# Patient Record
Sex: Female | Born: 2000 | Race: White | Hispanic: No | Marital: Single | Smoking: Never smoker
Health system: Southern US, Community
[De-identification: ages and names within clinical notes are randomized; demographics above are authoritative.]

## PROBLEM LIST (undated history)

## (undated) DIAGNOSIS — F909 Attention-deficit hyperactivity disorder, unspecified type: Secondary | ICD-10-CM

## (undated) DIAGNOSIS — F988 Other specified behavioral and emotional disorders with onset usually occurring in childhood and adolescence: Principal | ICD-10-CM

## (undated) HISTORY — PX: WISDOM TOOTH EXTRACTION: SHX21

## (undated) HISTORY — DX: Attention-deficit hyperactivity disorder, unspecified type: F90.9

## (undated) HISTORY — DX: Other specified behavioral and emotional disorders with onset usually occurring in childhood and adolescence: F98.8

---
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
## 2022-12-15 ENCOUNTER — Ambulatory Visit
Admission: RE | Admit: 2022-12-15 | Discharge: 2022-12-15 | Disposition: A | Discharge status: Home / Self Care | Payer: Managed Care, Other (non HMO) | Source: Ambulatory Visit | Attending: Family Medicine | Admitting: Family Medicine

## 2022-12-15 ENCOUNTER — Other Ambulatory Visit: Payer: Self-pay | Admitting: Family Medicine

## 2022-12-15 DIAGNOSIS — R1032 Left lower quadrant pain: Secondary | ICD-10-CM

## 2023-02-11 ENCOUNTER — Encounter: Payer: Self-pay | Admitting: Cardiology

## 2023-02-11 ENCOUNTER — Ambulatory Visit: Payer: 59 | Admitting: Cardiology

## 2023-02-11 ENCOUNTER — Other Ambulatory Visit: Payer: 59

## 2023-02-11 VITALS — BP 108/65 | HR 77 | Ht 67.0 in | Wt 164.0 lb

## 2023-02-11 DIAGNOSIS — R002 Palpitations: Secondary | ICD-10-CM

## 2023-02-11 DIAGNOSIS — Z8249 Family history of ischemic heart disease and other diseases of the circulatory system: Secondary | ICD-10-CM

## 2023-02-11 NOTE — Progress Notes (Signed)
Primary Physician/Referring:  Carilyn Goodpasture, NP  Patient ID: Gail Le, female    DOB: 12-03-2000, 22 y.o.   MRN: [id number]  Chief Complaint  Patient presents with   Dizziness and giddiness   Family history of ischemic heart disease and other diseases   Tachycardia, unspecified   New Patient (Initial Visit)   HPI:    Gail Le  is a 22 y.o.  fairly active Caucasian female patient referred to me for evaluation of palpitations.  Patient started having symptoms about a month ago, states that with physical activity and exercise she notices rapid heartbeat 37 new onset but does feel like it comes down slowly.  Sometimes associated with mild dizziness.  No chest pain.  She has at least 1-2 episodes over 3 to 4 days.  Otherwise she is asymptomatic, does not smoke, use tobacco products, no history of excessive alcohol intake, no illicit drug use, no recent weight changes.  She also does not drink excessive amounts of caffeine.   Past Medical History:  Diagnosis Date   ADD (attention deficit disorder) 07/20/2014   ADHD (attention deficit hyperactivity disorder)    Past Surgical History:  Procedure Laterality Date   MASS EXCISION Right 03/14/2019   Procedure: EXCISION OF RIGHT BREAST MASS;  Surgeon: Emelia Loron, MD;  Location: [facility];  Service: General;  Laterality: Right;   WISDOM TOOTH EXTRACTION     Family History  Problem Relation Age of Onset   Cardiomyopathy Father    Atrial fibrillation Maternal Grandfather    Cancer Maternal Grandfather     Social History   Tobacco Use   Smoking status: Never   Smokeless tobacco: Never  Substance Use Topics   Alcohol use: No   Marital Status: Single  ROS  Review of Systems  Cardiovascular:  Positive for palpitations. Negative for chest pain, dyspnea on exertion and leg swelling.   Objective      02/11/2023    9:15 AM 03/14/2019   10:15 AM 03/14/2019   10:00 AM  Vitals with BMI  Height 5\' 7"     Weight  164 lbs    BMI 25.68    Systolic 108 102 601  Diastolic 65 64 62  Pulse 77 74 83   SpO2: 99 %  Physical Exam Neck:     Vascular: No carotid bruit or JVD.  Cardiovascular:     Rate and Rhythm: Normal rate and regular rhythm.     Pulses: Intact distal pulses.     Heart sounds: Normal heart sounds. No murmur heard.    No gallop.  Pulmonary:     Effort: Pulmonary effort is normal.     Breath sounds: Normal breath sounds.  Abdominal:     General: Bowel sounds are normal.     Palpations: Abdomen is soft.  Musculoskeletal:     Right lower leg: No edema.     Left lower leg: No edema.    Laboratory examination:   External labs:   Labs 08/24/2022:  TSH normal at 1.51.  Hb 14.1/HCT 41.4, platelets 262, normal indicis.  Serum glucose 68 mg, BUN 9, creatinine 0.83, EGFR 103 mL, potassium 3.8, LFTs normal.  Total cholesterol 171, triglycerides 161, HDL 57, LDL 86.  Non-HDL cholesterol 114.  Radiology:    Cardiac Studies:   NA EKG:   EKG 02/11/2023: Normal sinus rhythm at rate of 82 beats minute, incomplete right bundle branch block.  Normal EKG.   Medications and allergies  No Known Allergies  Medication list   Current Outpatient Medications:    ADDERALL XR 25 MG 24 hr capsule, Take 25 mg by mouth every morning., Disp: , Rfl:    cetirizine (ZYRTEC ALLERGY) 10 MG tablet, Take 10 mg by mouth daily., Disp: , Rfl:    Ferrous Sulfate (IRON) 325 (65 Fe) MG TABS, Take 325 mg by mouth daily., Disp: , Rfl:    Magnesium 250 MG TABS, Take 2 tablets by mouth daily at 2 PM., Disp: , Rfl:    norgestimate-ethinyl estradiol (ORTHO-CYCLEN) 0.25-35 MG-MCG tablet, Take 1 tablet by mouth daily., Disp: , Rfl:   Assessment     ICD-10-CM   1. Tachycardia, unspecified  R00.0 EKG 12-Lead       Orders Placed This Encounter  Procedures   EKG 12-Lead    No orders of the defined types were placed in this encounter.   Medications Discontinued During This Encounter  Medication Reason    lisdexamfetamine (VYVANSE) 30 MG capsule      Recommendations:   Gail Le is a 22 y.o. fairly active Caucasian female patient referred to me for evaluation of palpitations.  1. Rapid palpitations Patient has rapid onset of palpitations but when they do stop, this slowly gradually come down, symptoms are indicated of both SVT and also they could be PACs and PVCs.  She will need Zio patch monitoring.  An echocardiogram was also ordered in view of patient's family history of hypertrophic cardiomyopathy in her father.  I will discuss with her father's cardiologist to see whether or genetic testing has been performed so it would impact his progeny.  - EKG 12-Lead - PCV ECHOCARDIOGRAM COMPLETE; Future - LONG TERM MONITOR (3-14 DAYS); Future  2. Family history of hypertrophic cardiomyopathy Father has been diagnosed with hypertrophic cardiomyopathy. He has also had NSVT episodes and heart failure and ascending aortic aneurysm and is SP ICD implant as well.  Complete details not aware, I will try to find out if he has had genetic testing.  An echocardiogram has been ordered for the patient herself.  I will see her back in 3 to 4 weeks for follow-up.  Advised no restrictions for now.  - PCV ECHOCARDIOGRAM COMPLETE; Future    Yates Decamp, MD, [facility] 02/11/2023, 10:23 AM Office: [phone]

## 2023-03-12 ENCOUNTER — Other Ambulatory Visit: Payer: 59

## 2023-03-17 ENCOUNTER — Encounter: Payer: Self-pay | Admitting: Cardiology

## 2023-03-17 ENCOUNTER — Ambulatory Visit: Payer: 59 | Admitting: Cardiology

## 2023-03-17 VITALS — BP 115/77 | HR 74 | Resp 16 | Ht 67.0 in | Wt 158.2 lb

## 2023-03-17 DIAGNOSIS — R002 Palpitations: Secondary | ICD-10-CM

## 2023-03-17 NOTE — Progress Notes (Signed)
Primary Physician/Referring:  Carilyn Goodpasture, NP  Patient ID: Gail Le, female    DOB: 2001/06/07, 22 y.o.   MRN: [id number]  Chief Complaint  Patient presents with   Palpitations   Follow-up   HPI:    Gail Le  is a 22 y.o.  fairly active Caucasian female patient referred to me for evaluation of palpitations.  Patient started having symptoms about a month ago, states that with physical activity and exercise she notices rapid heartbeat 37 new onset but does feel like it comes down slowly.  Sometimes associated with mild dizziness.  No chest pain.  She has at least 1-2 episodes over 3 to 4 days.  Otherwise she is asymptomatic, does not smoke, use tobacco products, no history of excessive alcohol intake, no illicit drug use, no recent weight changes.  She also does not drink excessive amounts of caffeine.  Underwent event monitor and presents for follow-up.  Past Medical History:  Diagnosis Date   ADD (attention deficit disorder) 07/20/2014   ADHD (attention deficit hyperactivity disorder)    Past Surgical History:  Procedure Laterality Date   MASS EXCISION Right 03/14/2019   Procedure: EXCISION OF RIGHT BREAST MASS;  Surgeon: Emelia Loron, MD;  Location: [facility];  Service: General;  Laterality: Right;   WISDOM TOOTH EXTRACTION     Family History  Problem Relation Age of Onset   Cardiomyopathy Father    Atrial fibrillation Maternal Grandfather    Cancer Maternal Grandfather     Social History   Tobacco Use   Smoking status: Never   Smokeless tobacco: Never  Substance Use Topics   Alcohol use: No   Marital Status: Single  ROS  Review of Systems  Cardiovascular:  Positive for palpitations. Negative for chest pain, dyspnea on exertion and leg swelling.   Objective      03/17/2023   11:40 AM 02/11/2023    9:15 AM 03/14/2019   10:15 AM  Vitals with BMI  Height 5\' 7"  5\' 7"    Weight 158 lbs 3 oz 164 lbs   BMI 24.77 25.68   Systolic 115 108  102  Diastolic 77 65 64  Pulse 74 77 74   SpO2: 98 %  Physical Exam Neck:     Vascular: No carotid bruit or JVD.  Cardiovascular:     Rate and Rhythm: Normal rate and regular rhythm.     Pulses: Intact distal pulses.     Heart sounds: Normal heart sounds. No murmur heard.    No gallop.  Pulmonary:     Effort: Pulmonary effort is normal.     Breath sounds: Normal breath sounds.  Abdominal:     General: Bowel sounds are normal.     Palpations: Abdomen is soft.  Musculoskeletal:     Right lower leg: No edema.     Left lower leg: No edema.    Laboratory examination:   External labs:   Labs 08/24/2022:  TSH normal at 1.51.  Hb 14.1/HCT 41.4, platelets 262, normal indicis.  Serum glucose 68 mg, BUN 9, creatinine 0.83, EGFR 103 mL, potassium 3.8, LFTs normal.  Total cholesterol 171, triglycerides 161, HDL 57, LDL 86.  Non-HDL cholesterol 114.  Radiology:    Cardiac Studies:   Zio Patch Extended out patient EKG monitoring 7 days starting 02/11/2023:   Predominant Rhythm : Normal sinus rhythm.  Min HR: 46 bpm. Max HR 176 bpm during exercise    Atrial arrhythmias: Rare PACs   Atrial fibrillation: None  Ventricular arrhythmias: Rare PVCs. PVC Burden <0.1%   Heart Block: None   Symptoms: Symptoms correlated with sinus tachycardia during exercise, patient complained of lightheadedness, fluttering, palpitations and dyspnea and blurry vision.  There was no SVT, there was no heart block, sinus tachycardia at the rate of 176 bpm noted.  EKG:   EKG 02/11/2023: Normal sinus rhythm at rate of 82 beats minute, incomplete right bundle branch block.  Normal EKG.   Medications and allergies  No Known Allergies   Medication list   Current Outpatient Medications:    ADDERALL XR 25 MG 24 hr capsule, Take 25 mg by mouth every morning., Disp: , Rfl:    cetirizine (ZYRTEC ALLERGY) 10 MG tablet, Take 10 mg by mouth daily., Disp: , Rfl:    Ferrous Sulfate (IRON) 325 (65 Fe) MG TABS,  Take 325 mg by mouth daily., Disp: , Rfl:    Magnesium 250 MG TABS, Take 2 tablets by mouth daily at 2 PM., Disp: , Rfl:    norgestimate-ethinyl estradiol (ORTHO-CYCLEN) 0.25-35 MG-MCG tablet, Take 1 tablet by mouth daily., Disp: , Rfl:   Assessment     ICD-10-CM   1. Rapid palpitations  R00.2        No orders of the defined types were placed in this encounter.   No orders of the defined types were placed in this encounter.   There are no discontinued medications.    Recommendations:   Gail Le is a 22 y.o. fairly active Caucasian female patient referred to me for evaluation of palpitations.  She underwent event monitoring and presents for follow-up.  1. Rapid palpitations I reviewed the results of the event monitor with the patient, advised her that her symptoms are related to deconditioning and sinus tachycardia, occasional PACs and PVCs.  She felt extremely relieved and knowing that this is benign findings, no further evaluation is indicated.  I have canceled echocardiogram in the absence of any other significant findings to suggest either SVT or atrial fibrillation or any other complaints arrhythmias.  Patient will contact me if she has further recurrence.  2. Family history of hypertrophic cardiomyopathy Father has been diagnosed with hypertrophic cardiomyopathy. He has also had NSVT episodes and heart failure and ascending aortic aneurysm and is SP ICD implant as well.  Patient's father recently has had genetic testing done, she will contact me once genetic testing is available.  If indeed happens to be truly hypertrophic cardiomyopathy, gene positive, she will then need echocardiogram and further evaluation.  At this point complete details are not available of her father's history.   Yates Decamp, MD, [facility] 03/19/2023, 5:03 PM Office: [phone]

## 2023-03-18 ENCOUNTER — Ambulatory Visit: Payer: 59 | Admitting: Cardiology
